# Patient Record
Sex: Male | Born: 1995 | Race: White | Hispanic: No | Marital: Single | Smoking: Never smoker
Health system: Southern US, Community
[De-identification: ages and names within clinical notes are randomized; demographics above are authoritative.]

---
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
## 2017-03-06 ENCOUNTER — Ambulatory Visit: Payer: Self-pay | Admitting: Nurse Practitioner
